# Patient Record
Sex: Female | Born: 1965 | Race: White | Hispanic: No | Marital: Married | Smoking: Never smoker
Health system: Southern US, Community
[De-identification: ages and names within clinical notes are randomized; demographics above are authoritative.]

## PROBLEM LIST (undated history)

## (undated) DIAGNOSIS — G8929 Other chronic pain: Secondary | ICD-10-CM

## (undated) DIAGNOSIS — M542 Cervicalgia: Secondary | ICD-10-CM

## (undated) DIAGNOSIS — D693 Immune thrombocytopenic purpura: Secondary | ICD-10-CM

## (undated) HISTORY — PX: HERNIA REPAIR: SHX51

## (undated) HISTORY — PX: BACK SURGERY: SHX140

## (undated) HISTORY — PX: CHOLECYSTECTOMY: SHX55

---

## 2002-01-10 ENCOUNTER — Ambulatory Visit (HOSPITAL_COMMUNITY): Admission: RE | Admit: 2002-01-10 | Discharge: 2002-01-10 | Payer: Self-pay | Admitting: Internal Medicine

## 2005-09-15 ENCOUNTER — Ambulatory Visit (HOSPITAL_COMMUNITY): Admission: RE | Admit: 2005-09-15 | Discharge: 2005-09-15 | Payer: Self-pay | Admitting: Neurological Surgery

## 2005-12-30 ENCOUNTER — Encounter (HOSPITAL_COMMUNITY): Admission: RE | Admit: 2005-12-30 | Discharge: 2006-01-29 | Payer: Self-pay | Admitting: Neurological Surgery

## 2006-01-30 ENCOUNTER — Encounter (HOSPITAL_COMMUNITY): Admission: RE | Admit: 2006-01-30 | Discharge: 2006-03-01 | Payer: Self-pay | Admitting: Neurological Surgery

## 2009-11-14 ENCOUNTER — Emergency Department (HOSPITAL_COMMUNITY): Admission: EM | Admit: 2009-11-14 | Discharge: 2009-11-15 | Payer: Self-pay | Admitting: Emergency Medicine

## 2010-12-31 NOTE — Op Note (Signed)
[name], [name] NO.:  [id number]   MEDICAL RECORD NO.:  [id number]          PATIENT TYPE:  AMB   LOCATION:  SDS                          FACILITY:  [facility]   PHYSICIAN:  Tia Alert, MD     DATE OF BIRTH:  Jan 11, 1966   DATE OF PROCEDURE:  09/15/2005  DATE OF DISCHARGE:                                 OPERATIVE REPORT   PREOPERATIVE DIAGNOSIS:  Lumbar disk herniation, L5-S1 on the left, with a  left S1 radiculopathy.   POSTOPERATIVE DIAGNOSIS:  Lumbar disk herniation, L5-S1 on the left, with a  left S1 radiculopathy.   PROCEDURE:  Lumbar hemilaminectomy, medial facetectomy and foraminotomy, L5-  S1 on the left, followed by microdiskectomy, L5-S1 on the left, utilizing  microscopic dissection.   SURGEON:  Tia Alert, M.D.   ASSISTANT:  Reinaldo Meeker, M.D.   ANESTHESIA:  General endotracheal.   COMPLICATIONS:  None apparent.   INDICATIONS FOR PROCEDURE:  Mrs. Vanessa [name] is a very pleasant 45 year old white  female, who was referred with left leg pain.  She had an MRI, which showed a  very large disk herniation at L5-S1 on the left with left S1 compression.  She had some weakness in her left calf and had lost her reflex and was  having fasciculations in the calf.  I recommended a microdiskectomy at L5-S1  on the left side.  She understood the risks and benefits and expected  outcome and wished to proceed.   DESCRIPTION OF PROCEDURE:  The patient was taken to the operating room and  after induction of adequate generalized endotracheal anesthesia, she was  rolled into the prone position on the Wilson frame and all pressure points  were padded and her lumbar region was prepped with DuraPrep and draped in  the usual sterile fashion.  Local anesthesia 10 mL was injected and a small  dorsal midline incision was made and carried down to the lumbosacral fascia.  The fascia was opened on the patient's left side and taken down in a  subperiosteal fashion to  expose L5-S1 on the left side.  Intraoperative x-  ray confirmed my level and then a hemilaminectomy, medial facetectomy and  foraminotomy was performed at L5-S1 on the left side.  The yellow ligament  was identified, opened and removed in a piecemeal fashion, exposing the  underlying dura and S1 nerve root.  The nerve root was thickened and red.  There was a free disk fragment extending out lateral to the nerve, which was  removed with a nerve hook and a pituitary rongeur.  I then were retracted  the nerve root medially and utilizing microscopic dissection incised the  disk space and performed a thorough intradiskal diskectomy.  I could palpate  the L5 as well as the S1 pedicles.  The midline was decompressed.  The nerve  roots were examined and found to be free of further compression.  A nerve  hook was passed easily into the foramina.  A coronary dilator was passed  easily into the midline and into the foramina once we were assured  we had a  nice diskectomy and decompression, the wound was copiously irrigated with  bacitracin-containing saline solution.  The nerve root was inspected once  again.  The dura was lined with Gelfoam and then the fascia was closed with  0 Vicryl.  The subcutaneous and subcuticular tissues were closed with 2-0  and 3-0 Vicryl and  the skin was closed with Dermabond.  The drapes were removed.  The patient  was awakened from general anesthesia and transferred to the recovery room in  stable condition.  At the end of procedure all sponge, needle and sponge  counts were correct.      Tia Alert, MD  Electronically Signed     DSJ/MEDQ  D:  09/15/2005  T:  09/15/2005  Job:  [id number]

## 2010-12-31 NOTE — Op Note (Signed)
[facility]  Patient:    Vanessa [name], Vanessa [name] Visit Number: [id number] MRN: [id number]          Service Type: END Location: DAY Attending Physician:  Malissa Hippo Dictated by:   Lionel December, M.D. Proc. Date: 01/10/02 Admit Date:  01/10/2002   CC:         Dr. Malvin Johns, Dyanne Iha   Operative Report  PROCEDURE:  Esophagogastroduodenoscopy followed by total colonoscopy.  INDICATIONS:  Vanessa [name] is a 45 year old Caucasian female who was evaluated in the office for GERD refractory therapy.  She was seen in the office on Dec 20, 2001, and she was scheduled for esophagogastroduodenoscopy.  She has been on Nexium 40 mg b.i.d. with minimal benefit.  A few days after her office visit, she called, stating that she had 1-1/2 days of rectal bleeding. Therefore, she requested that her colonoscopy also be performed at the time of EGD.  PROCEDURE:  Both of the procedures were reviewed with the patient, and an informed consent was obtained.  She has not had any more bleeding since that episode.  She did have some abdominal cramps, but these are resolved. She did not experience any fever or chills.  PREOPERATIVE MEDICATIONS:  Cetacaine spray for oropharyngeal topical anesthesia, Demerol 50 mg IV, Versed 9 mg IV in divided dose.  INSTRUMENT:  Olympus video system.  FINDINGS:  Both of the procedures were performed in the endoscopy suite.  The patients vital signs and O2 saturation were monitored during the procedure and remained stable.  PROCEDURE #1:  Esophagogastroduodenoscopy.  The patient was placed in the left lateral position, and the endoscope was passed via oropharynx without any difficulty into the esophagus.  Esophagus:  The mucosa of the esophagus was normal throughout.  No changes were noted to suggest Barretts esophagus.  There was a focal area of edema at the GE junction, but no ring or stricture was noted.  There was a small to moderate size  sliding hiatal hernia.  As she heaved during the procedure, there was a prolapse of gastric folds into the esophagus.  Stomach: It was empty and distended, very well insufflation.  Folds in the proximal stomach were normal.  Examination of the mucosa at the gastric body, antrum, and pyloric channel, as well as angularis and fundus was normal.  Duodenum: Examination of the bulb and second part of the duodenum was also normal.  The endoscope was withdrawn.  The patient was prepared for procedure #2.  Total colonoscopy:  Rectal examination performed.  No abnormality noted on external or digital exam.  The scope was placed in the rectum and advanced to the region of the sigmoid colon beyond.  Preparation was satisfactory.  The scope was passed in the cecum, which was identified by the ileocecal valve and appendiceal orifice. Pictures were taken for the record.  As the scope was withdrawn, the mucosa was carefully examined.  There was a tiny polyp at hepatic flexure, which was ablated by cold biopsy.  The mucosa and rest of the colon was normal. The rectal mucosa similarly was normal.  She had a single small hemorrhoid below the dentate line. The endoscope was straightened and withdrawn.  The patient tolerated the procedure well.  FINAL DIAGNOSES: 1. Mild changes of reflux esophagitis limited to gastroesophageal junction.    Small to moderate size sliding hiatal hernia. 2. Normal examination of the stomach, first and second part of duodenum. 3. Single small external hemorrhoid and tiny polyp at hepatic flexure, which  is ablated by cold biopsy.  The rest of the exam was normal. 4. As far as the rectal bleeding is concerned, it is possible that she had    self-limiting bout of colitis.  No further workup necessary unless rectal    bleeding was to relapse.  RECOMMENDATIONS: 1. Antireflux measures reinforced. 2. For now, leave her on Nexium 40 mg b.i.d. and will start her on Reglan    10  mg before each meal and at bedtime.  She was informed of possible    side effects.  If she has any problems, she can simply stop the medication. 3. We will plan to see her back in the office in one month from now.  If she    fails this approach, antireflux surgery would be an option, but I would    like to see that she has a lost a few pounds and is also able to quit    cigarette smoking. Dictated by:   Lionel December, M.D. Attending Physician:  Malissa Hippo DD:  01/10/02 TD:  01/11/02 Job: [id number]

## 2014-07-30 ENCOUNTER — Emergency Department (HOSPITAL_COMMUNITY)
Admission: EM | Admit: 2014-07-30 | Discharge: 2014-07-30 | Disposition: A | Discharge status: Home / Self Care | Payer: BC Managed Care – PPO | Attending: Emergency Medicine | Admitting: Emergency Medicine

## 2014-07-30 ENCOUNTER — Encounter (HOSPITAL_COMMUNITY): Payer: Self-pay | Admitting: *Deleted

## 2014-07-30 ENCOUNTER — Emergency Department (HOSPITAL_COMMUNITY): Payer: BC Managed Care – PPO

## 2014-07-30 DIAGNOSIS — Z862 Personal history of diseases of the blood and blood-forming organs and certain disorders involving the immune mechanism: Secondary | ICD-10-CM | POA: Insufficient documentation

## 2014-07-30 DIAGNOSIS — M542 Cervicalgia: Secondary | ICD-10-CM | POA: Diagnosis present

## 2014-07-30 DIAGNOSIS — Z7952 Long term (current) use of systemic steroids: Secondary | ICD-10-CM | POA: Diagnosis not present

## 2014-07-30 DIAGNOSIS — G8929 Other chronic pain: Secondary | ICD-10-CM | POA: Diagnosis not present

## 2014-07-30 DIAGNOSIS — Z79899 Other long term (current) drug therapy: Secondary | ICD-10-CM | POA: Insufficient documentation

## 2014-07-30 DIAGNOSIS — M545 Low back pain: Secondary | ICD-10-CM | POA: Diagnosis not present

## 2014-07-30 HISTORY — DX: Immune thrombocytopenic purpura: D69.3

## 2014-07-30 HISTORY — DX: Cervicalgia: M54.2

## 2014-07-30 HISTORY — DX: Other chronic pain: G89.29

## 2014-07-30 MED ORDER — CYCLOBENZAPRINE HCL 10 MG PO TABS
10.0000 mg | ORAL_TABLET | Freq: Once | ORAL | Status: AC
  Administered 2014-07-30: 10 mg via ORAL
  Filled 2014-07-30: qty 1

## 2014-07-30 MED ORDER — CYCLOBENZAPRINE HCL 10 MG PO TABS: 10.0000 mg | ORAL_TABLET | Freq: Three times a day (TID) | ORAL | Status: DC | PRN

## 2014-07-30 MED ORDER — PREDNISONE 10 MG PO TABS: ORAL_TABLET | ORAL | Status: DC

## 2014-07-30 MED ORDER — OXYCODONE-ACETAMINOPHEN 5-325 MG PO TABS
1.0000 | ORAL_TABLET | Freq: Once | ORAL | Status: AC
  Administered 2014-07-30: 1 via ORAL
  Filled 2014-07-30: qty 1

## 2014-07-30 MED ORDER — OXYCODONE-ACETAMINOPHEN 5-325 MG PO TABS: 1.0000 | ORAL_TABLET | ORAL | Status: DC | PRN

## 2014-07-30 NOTE — ED Notes (Signed)
Pt states she has always had neck problems but they have become worse over past few weeks.

## 2014-08-01 NOTE — ED Provider Notes (Signed)
CSN: [id number]     Arrival date & time 07/30/14  1817 History   First MD Initiated Contact with Patient 07/30/14 1832     Chief Complaint  Patient presents with  . Neck Pain     (Consider location/radiation/quality/duration/timing/severity/associated sxs/prior Treatment) HPI  Vanessa [name] is a 48 y.o. female with hx of chronic neck pain presents to the Emergency Department complaining of recurrent , worsening neck pain for several weeks.  She describes a localized sharp, numb, tingling sensation to the base of her neck with pain to her left neck and across the top of her shoulder.  She states the pain is worse with movement.  She reports having similar pain in her lower back in the past.  She states that sometimes, she will have pains "shoot" into her left arm, but not continuously.  She has tried OTC pain relievers without significant improvement.  She denies known injury, headaches, chest pain, shortness of breath, dizziness, numbness or weakness of the upper extremities.  She denies previous imaging of her neck.     Past Medical History  Diagnosis Date  . Neck pain, chronic   . ITP (idiopathic thrombocytopenic purpura)     at age 48 but no problems since then.   Past Surgical History  Procedure Laterality Date  . Cholecystectomy    . Hernia repair    . Back surgery     No family history on file. History  Substance Use Topics  . Smoking status: Never Smoker   . Smokeless tobacco: Not on file  . Alcohol Use: No   OB History    No data available     Review of Systems  Constitutional: Negative for fever.  Respiratory: Negative for shortness of breath.   Cardiovascular: Negative for chest pain.  Gastrointestinal: Negative for vomiting, abdominal pain and constipation.  Genitourinary: Negative for dysuria, hematuria, flank pain, decreased urine volume and difficulty urinating.       Low back pain  Musculoskeletal: Positive for neck pain. Negative for back pain and joint  swelling.  Skin: Negative for rash.  Neurological: Negative for dizziness, syncope, facial asymmetry, speech difficulty, weakness, numbness and headaches.  All other systems reviewed and are negative.     Allergies  Septra and Wellbutrin  Home Medications   Prior to Admission medications   Medication Sig Start Date End Date Taking? Authorizing Provider  ibuprofen (ADVIL,MOTRIN) 200 MG tablet Take 800 mg by mouth every 8 (eight) hours as needed (pain).   Yes Historical Provider, MD  cyclobenzaprine (FLEXERIL) 10 MG tablet Take 1 tablet (10 mg total) by mouth 3 (three) times daily as needed. 07/30/14   Alamin Mccuiston L. Gabreille Dardis, PA-C  oxyCODONE-acetaminophen (PERCOCET/ROXICET) 5-325 MG per tablet Take 1 tablet by mouth every 4 (four) hours as needed. 07/30/14   Lio Wehrly L. Steffan Caniglia, PA-C  predniSONE (DELTASONE) 10 MG tablet Take 6 tablets day one, 5 tablets day two, 4 tablets day three, 3 tablets day four, 2 tablets day five, then 1 tablet day six 07/30/14   Dorotha Hirschi L. Zvi Duplantis, PA-C   BP 142/82 mmHg  Pulse 85  Temp(Src) 98.2 F (36.8 C) (Oral)  Resp 20  Ht 5\' 6"  (1.676 m)  Wt 169 lb (76.658 kg)  BMI 27.29 kg/m2  SpO2 100%  LMP 07/30/2014 (Exact Date) Physical Exam  Constitutional: She is oriented to person, place, and time. She appears well-developed and well-nourished. No distress.  HENT:  Head: Normocephalic and atraumatic.  Mouth/Throat: Oropharynx is clear and moist.  Eyes: EOM are normal. Pupils are equal, round, and reactive to light.  Neck: Phonation normal. Neck supple. Spinous process tenderness and muscular tenderness present. No rigidity. Decreased range of motion present. No erythema present. No thyromegaly present.    ttp of the mid to lower cervical spine and left paraspinal muscles and along the left trapezius muscle.  Grip strength is strong and equal bilaterally.  Distal sensation and bilateral radial pulses intact,    5/5 strength against resistance of the bilateral UE's    Cardiovascular: Normal rate, regular rhythm, normal heart sounds and intact distal pulses.   No murmur heard. Pulmonary/Chest: Effort normal and breath sounds normal. No respiratory distress. She exhibits no tenderness.  Musculoskeletal: She exhibits tenderness. She exhibits no edema.       Cervical back: She exhibits tenderness. She exhibits normal range of motion, no bony tenderness, no swelling, no deformity, no spasm and normal pulse.  See neck exam  Lymphadenopathy:    She has no cervical adenopathy.  Neurological: She is alert and oriented to person, place, and time. She has normal strength. No sensory deficit. She exhibits normal muscle tone. Coordination normal.  Reflex Scores:      Tricep reflexes are 2+ on the right side and 2+ on the left side.      Bicep reflexes are 2+ on the right side and 2+ on the left side. Skin: Skin is warm and dry.  Nursing note and vitals reviewed.   ED Course  Procedures (including critical care time) Labs Review Labs Reviewed - No data to display  Imaging Review Dg Cervical Spine Complete  07/30/2014   CLINICAL DATA:  Worsening neck pain over the last 2 weeks. No recent injury.  EXAM: CERVICAL SPINE  4+ VIEWS  COMPARISON:  None.  FINDINGS: The lateral view images through the bottom of C7. Prevertebral soft tissues are within normal limits. Reversal of expected cervical lordosis, centered about the C6 level. Maintenance of vertebral body height. Evaluation of the left neural foramina is limited secondary to positioning on the oblique image. There is likely neural foraminal narrowing at C6-7. On the right, there is multilevel neural foraminal narrowing, including at C 5-6 and C6-7. Lateral masses and odontoid process are within normal limits.  Cervical thoracic junction not well evaluated on swimmer's view. Markedly age advanced cervical spondylosis. Loss of intervertebral disc height and endplate osteophyte formation which is most advanced at C5-7.  Extensive facet arthropathy at multiple levels, including C2-3 and C3-4.  IMPRESSION: Markedly age advanced spondylosis, most advanced at C5-7. Straightening and mild reversal of expected lordosis centered about C6 which is likely secondary.  Suboptimal evaluation of the cervical thoracic junction.   Electronically Signed   By: Jeronimo Greaves M.D.   On: 07/30/2014 19:33     EKG Interpretation None      MDM   Final diagnoses:  Neck pain    Patient here for recurrent, chronic neck pain.  She is requesting MRI.  She is well appearing without focal neurological deficits.  I have explained to her that emergent MRI is not warranted at this time but may be needed for definitive diagnosis.  She agrees to plain film imaging tonight and she has appt with her PMD on Tuesday.    Patient is feeling better after medication, ready for d/c.  I have reviewed the imaging results with her and given her copies to take with her to her PMD in [place].  She agrees to prednisone taper, flexeril and  percocet.  She appears stable for d/c    Sahan Pen L. Trisha Mangleriplett, PA-C 08/01/14 1334  Flint MelterElliott L Wentz, MD 08/02/14 [phone]

## 2014-12-16 ENCOUNTER — Emergency Department (HOSPITAL_COMMUNITY)
Admission: EM | Admit: 2014-12-16 | Discharge: 2014-12-16 | Disposition: A | Discharge status: Home / Self Care | Payer: BC Managed Care – PPO | Attending: Emergency Medicine | Admitting: Emergency Medicine

## 2014-12-16 ENCOUNTER — Encounter (HOSPITAL_COMMUNITY): Payer: Self-pay | Admitting: Emergency Medicine

## 2014-12-16 DIAGNOSIS — Y92410 Unspecified street and highway as the place of occurrence of the external cause: Secondary | ICD-10-CM | POA: Diagnosis not present

## 2014-12-16 DIAGNOSIS — Y998 Other external cause status: Secondary | ICD-10-CM | POA: Diagnosis not present

## 2014-12-16 DIAGNOSIS — Y9389 Activity, other specified: Secondary | ICD-10-CM | POA: Diagnosis not present

## 2014-12-16 DIAGNOSIS — S199XXA Unspecified injury of neck, initial encounter: Secondary | ICD-10-CM | POA: Diagnosis present

## 2014-12-16 DIAGNOSIS — G8929 Other chronic pain: Secondary | ICD-10-CM | POA: Insufficient documentation

## 2014-12-16 DIAGNOSIS — S161XXA Strain of muscle, fascia and tendon at neck level, initial encounter: Secondary | ICD-10-CM | POA: Insufficient documentation

## 2014-12-16 DIAGNOSIS — Z862 Personal history of diseases of the blood and blood-forming organs and certain disorders involving the immune mechanism: Secondary | ICD-10-CM | POA: Insufficient documentation

## 2014-12-16 MED ORDER — DIAZEPAM 5 MG PO TABS: 5.0000 mg | ORAL_TABLET | Freq: Four times a day (QID) | ORAL | Status: DC | PRN

## 2014-12-16 MED ORDER — DIAZEPAM 5 MG PO TABS
5.0000 mg | ORAL_TABLET | Freq: Once | ORAL | Status: AC
  Administered 2014-12-16: 5 mg via ORAL
  Filled 2014-12-16: qty 1

## 2014-12-16 MED ORDER — IBUPROFEN 400 MG PO TABS
600.0000 mg | ORAL_TABLET | Freq: Once | ORAL | Status: AC
  Administered 2014-12-16: 600 mg via ORAL
  Filled 2014-12-16: qty 2

## 2014-12-16 MED ORDER — ACETAMINOPHEN 500 MG PO TABS
1000.0000 mg | ORAL_TABLET | Freq: Once | ORAL | Status: AC
  Administered 2014-12-16: 1000 mg via ORAL
  Filled 2014-12-16: qty 2

## 2014-12-16 NOTE — ED Notes (Signed)
Pt alert & oriented x4, stable gait. Patient given discharge instructions, paperwork & prescription(s). Patient informed not to drive, operate any equipment & handel any important documents 4 hours after taking pain medication. Patient  instructed to stop at the registration desk to finish any additional paperwork. Patient  verbalized understanding. Pt left department w/ no further questions. 
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
## 2014-12-16 NOTE — Discharge Instructions (Signed)
If you were given medicines take as directed.  If you are on coumadin or contraceptives realize their levels and effectiveness is altered by many different medicines.  If you have any reaction (rash, tongues swelling, other) to the medicines stop taking and see a physician.   Please follow up as directed and return to the ER or see a physician for new or worsening symptoms.  Thank you. Filed Vitals:   12/16/14 1756 12/16/14 1929  BP: 132/93 140/93  Pulse: 70 77  Temp: 97.7 F (36.5 C) 97.6 F (36.4 C)  TempSrc: Oral Oral  Resp: 18 18  Height:  (1.651 m)   Weight: 179 lb (81.194 kg)   SpO2: 100% 100%    Cervical Sprain A cervical sprain is an injury in the neck in which the strong, fibrous tissues (ligaments) that connect your neck bones stretch or tear. Cervical sprains can range from mild to severe. Severe cervical sprains can cause the neck vertebrae to be unstable. This can lead to damage of the spinal cord and can result in serious nervous system problems. The amount of time it takes for a cervical sprain to get better depends on the cause and extent of the injury. Most cervical sprains heal in 1 to 3 weeks. CAUSES  Severe cervical sprains may be caused by:   Contact sport injuries (such as from football, rugby, wrestling, hockey, auto racing, gymnastics, diving, martial arts, or boxing).   Motor vehicle collisions.   Whiplash injuries. This is an injury from a sudden forward and backward whipping movement of the head and neck.  Falls.  Mild cervical sprains may be caused by:   Being in an awkward position, such as while cradling a telephone between your ear and shoulder.   Sitting in a chair that does not offer proper support.   Working at a poorly Marketing executive station.   Looking up or down for long periods of time.  SYMPTOMS   Pain, soreness, stiffness, or a burning sensation in the front, back, or sides of the neck. This discomfort may develop  immediately after the injury or slowly, 24 hours or more after the injury.   Pain or tenderness directly in the middle of the back of the neck.   Shoulder or upper back pain.   Limited ability to move the neck.   Headache.   Dizziness.   Weakness, numbness, or tingling in the hands or arms.   Muscle spasms.   Difficulty swallowing or chewing.   Tenderness and swelling of the neck.  DIAGNOSIS  Most of the time your health care provider can diagnose a cervical sprain by taking your history and doing a physical exam. Your health care provider will ask about previous neck injuries and any known neck problems, such as arthritis in the neck. X-rays may be taken to find out if there are any other problems, such as with the bones of the neck. Other tests, such as a CT scan or MRI, may also be needed.  TREATMENT  Treatment depends on the severity of the cervical sprain. Mild sprains can be treated with rest, keeping the neck in place (immobilization), and pain medicines. Severe cervical sprains are immediately immobilized. Further treatment is done to help with pain, muscle spasms, and other symptoms and may include:  Medicines, such as pain relievers, numbing medicines, or muscle relaxants.   Physical therapy. This may involve stretching exercises, strengthening exercises, and posture training. Exercises and improved posture can help stabilize the neck,  strengthen muscles, and help stop symptoms from returning.  HOME CARE INSTRUCTIONS   Put ice on the injured area.   Put ice in a plastic bag.   Place a towel between your skin and the bag.   Leave the ice on for 15-20 minutes, 3-4 times a day.   If your injury was severe, you may have been given a cervical collar to wear. A cervical collar is a two-piece collar designed to keep your neck from moving while it heals.  Do not remove the collar unless instructed by your health care provider.  If you have long hair, keep it  outside of the collar.  Ask your health care provider before making any adjustments to your collar. Minor adjustments may be required over time to improve comfort and reduce pressure on your chin or on the back of your head.  Ifyou are allowed to remove the collar for cleaning or bathing, follow your health care provider's instructions on how to do so safely.  Keep your collar clean by wiping it with mild soap and water and drying it completely. If the collar you have been given includes removable pads, remove them every 1-2 days and hand wash them with soap and water. Allow them to air dry. They should be completely dry before you wear them in the collar.  If you are allowed to remove the collar for cleaning and bathing, wash and dry the skin of your neck. Check your skin for irritation or sores. If you see any, tell your health care provider.  Do not drive while wearing the collar.   Only take over-the-counter or prescription medicines for pain, discomfort, or fever as directed by your health care provider.   Keep all follow-up appointments as directed by your health care provider.   Keep all physical therapy appointments as directed by your health care provider.   Make any needed adjustments to your workstation to promote good posture.   Avoid positions and activities that make your symptoms worse.   Warm up and stretch before being active to help prevent problems.  SEEK MEDICAL CARE IF:   Your pain is not controlled with medicine.   You are unable to decrease your pain medicine over time as planned.   Your activity level is not improving as expected.  SEEK IMMEDIATE MEDICAL CARE IF:   You develop any bleeding.  You develop stomach upset.  You have signs of an allergic reaction to your medicine.   Your symptoms get worse.   You develop new, unexplained symptoms.   You have numbness, tingling, weakness, or paralysis in any part of your body.  MAKE SURE YOU:    Understand these instructions.  Will watch your condition.  Will get help right away if you are not doing well or get worse. Document Released: 05/29/2007 Document Revised: 08/06/2013 Document Reviewed: 02/06/2013 [facility] Patient Information 2015 [place], [place]. This information is not intended to replace advice given to you by your health care provider. Make sure you discuss any questions you have with your health care provider.
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
## 2016-01-22 ENCOUNTER — Emergency Department (HOSPITAL_COMMUNITY): Payer: BC Managed Care – PPO

## 2016-01-22 ENCOUNTER — Encounter (HOSPITAL_COMMUNITY): Payer: Self-pay

## 2016-01-22 ENCOUNTER — Emergency Department (HOSPITAL_COMMUNITY)
Admission: EM | Admit: 2016-01-22 | Discharge: 2016-01-22 | Disposition: A | Discharge status: Home / Self Care | Payer: BC Managed Care – PPO | Attending: Emergency Medicine | Admitting: Emergency Medicine

## 2016-01-22 DIAGNOSIS — R079 Chest pain, unspecified: Secondary | ICD-10-CM | POA: Diagnosis not present

## 2016-01-22 DIAGNOSIS — R1031 Right lower quadrant pain: Secondary | ICD-10-CM | POA: Diagnosis present

## 2016-01-22 DIAGNOSIS — K219 Gastro-esophageal reflux disease without esophagitis: Secondary | ICD-10-CM | POA: Diagnosis not present

## 2016-01-22 LAB — CBC WITH DIFFERENTIAL/PLATELET
Basophils Absolute: 0 10*3/uL (ref 0.0–0.1)
Basophils Relative: 0 %
EOS ABS: 0.2 10*3/uL (ref 0.0–0.7)
EOS PCT: 2 %
HCT: 42.5 % (ref 36.0–46.0)
HEMOGLOBIN: 14.2 g/dL (ref 12.0–15.0)
LYMPHS ABS: 2.8 10*3/uL (ref 0.7–4.0)
LYMPHS PCT: 28 %
MCH: 33.1 pg (ref 26.0–34.0)
MCHC: 33.4 g/dL (ref 30.0–36.0)
MCV: 99.1 fL (ref 78.0–100.0)
MONO ABS: 0.9 10*3/uL (ref 0.1–1.0)
Monocytes Relative: 9 %
Neutro Abs: 6 10*3/uL (ref 1.7–7.7)
Neutrophils Relative %: 61 %
PLATELETS: 307 10*3/uL (ref 150–400)
RBC: 4.29 MIL/uL (ref 3.87–5.11)
RDW: 12.8 % (ref 11.5–15.5)
WBC: 9.9 10*3/uL (ref 4.0–10.5)

## 2016-01-22 LAB — URINALYSIS, ROUTINE W REFLEX MICROSCOPIC
Bilirubin Urine: NEGATIVE
GLUCOSE, UA: NEGATIVE mg/dL
HGB URINE DIPSTICK: NEGATIVE
Ketones, ur: NEGATIVE mg/dL
Leukocytes, UA: NEGATIVE
Nitrite: NEGATIVE
PH: 5.5 (ref 5.0–8.0)
Protein, ur: NEGATIVE mg/dL
SPECIFIC GRAVITY, URINE: 1.025 (ref 1.005–1.030)

## 2016-01-22 LAB — COMPREHENSIVE METABOLIC PANEL
ALBUMIN: 4.2 g/dL (ref 3.5–5.0)
ALT: 12 U/L — AB (ref 14–54)
AST: 14 U/L — AB (ref 15–41)
Alkaline Phosphatase: 70 U/L (ref 38–126)
Anion gap: 7 (ref 5–15)
BUN: 7 mg/dL (ref 6–20)
CHLORIDE: 102 mmol/L (ref 101–111)
CO2: 28 mmol/L (ref 22–32)
CREATININE: 0.79 mg/dL (ref 0.44–1.00)
Calcium: 8.9 mg/dL (ref 8.9–10.3)
GFR calc Af Amer: >60 mL/min (ref >60)
GLUCOSE: 105 mg/dL — AB (ref 65–99)
Potassium: 3.1 mmol/L — ABNORMAL LOW (ref 3.5–5.1)
Sodium: 137 mmol/L (ref 135–145)
Total Bilirubin: 0.3 mg/dL (ref 0.3–1.2)
Total Protein: 7.4 g/dL (ref 6.5–8.1)

## 2016-01-22 LAB — TROPONIN I: Troponin I: <0.03 ng/mL (ref <0.031)

## 2016-01-22 LAB — LIPASE, BLOOD: LIPASE: 36 U/L (ref 11–51)

## 2016-01-22 MED ORDER — KETOROLAC TROMETHAMINE 30 MG/ML IJ SOLN
30.0000 mg | Freq: Once | INTRAMUSCULAR | Status: AC
  Administered 2016-01-22: 30 mg via INTRAVENOUS
  Filled 2016-01-22: qty 1

## 2016-01-22 MED ORDER — IOPAMIDOL (ISOVUE-300) INJECTION 61%
100.0000 mL | Freq: Once | INTRAVENOUS | Status: AC | PRN
  Administered 2016-01-22: 100 mL via INTRAVENOUS

## 2016-01-22 MED ORDER — SODIUM CHLORIDE 0.9 % IV BOLUS (SEPSIS)
1000.0000 mL | Freq: Once | INTRAVENOUS | Status: AC
  Administered 2016-01-22: 1000 mL via INTRAVENOUS

## 2016-01-22 MED ORDER — DIATRIZOATE MEGLUMINE & SODIUM 66-10 % PO SOLN
ORAL | Status: AC
  Filled 2016-01-22: qty 30

## 2016-01-22 MED ORDER — PANTOPRAZOLE SODIUM 40 MG PO TBEC
40.0000 mg | DELAYED_RELEASE_TABLET | Freq: Once | ORAL | Status: AC
  Administered 2016-01-22: 40 mg via ORAL
  Filled 2016-01-22: qty 1

## 2016-01-22 MED ORDER — ONDANSETRON 4 MG PO TBDP: 4.0000 mg | ORAL_TABLET | Freq: Three times a day (TID) | ORAL | Status: AC | PRN

## 2016-01-22 MED ORDER — GI COCKTAIL ~~LOC~~
30.0000 mL | Freq: Once | ORAL | Status: AC
  Administered 2016-01-22: 30 mL via ORAL
  Filled 2016-01-22: qty 30

## 2016-01-22 MED ORDER — SUCRALFATE 1 GM/10ML PO SUSP: 1.0000 g | Freq: Three times a day (TID) | ORAL | Status: AC

## 2016-01-22 MED ORDER — POTASSIUM CHLORIDE CRYS ER 20 MEQ PO TBCR
40.0000 meq | EXTENDED_RELEASE_TABLET | Freq: Once | ORAL | Status: AC
  Administered 2016-01-22: 40 meq via ORAL
  Filled 2016-01-22: qty 2

## 2016-01-22 MED ORDER — PANTOPRAZOLE SODIUM 40 MG PO TBEC: 40.0000 mg | DELAYED_RELEASE_TABLET | Freq: Every day | ORAL | Status: AC

## 2016-01-22 MED ORDER — ONDANSETRON HCL 4 MG/2ML IJ SOLN
4.0000 mg | Freq: Once | INTRAMUSCULAR | Status: AC
  Administered 2016-01-22: 4 mg via INTRAVENOUS
  Filled 2016-01-22: qty 2

## 2016-01-22 NOTE — ED Provider Notes (Signed)
TIME SEEN: 4:50 AM  CHIEF COMPLAINT: Multiple complaints  HPI: Pt is a 50 y.o. female with history of chronic neck pain, ITP when she was 50 years old who presents to the emergency department with multiple different complaints. She states her major reason for coming to the emergency department his right-sided sharp stabbing flank pain that has now migrated to the right lower abdomen. Has had nausea but no vomiting. Has had loose stools for the past 3 days after eating. Denies history of kidney stone. Status post cholecystectomy. No aggravating or relieving factors. Has never had similar symptoms. Denies dysuria, hematuria, vaginal bleeding or discharge, bloody stools or melena. She is postmenopausal.   Patient also complains of feeling bloated for the past year. States she has seen her PCP Dr. Clelia Croft in [place] for this. Does not have a gastroenterology was. Reports she had an endoscopy and colonoscopy that she reports was normal over 15 years ago.   Also reports that she has noticed intermittent swelling in her hands and feet for the past several months. None currently.   Also complaining of her chronic neck pain is worse with movement of her neck and sitting upright in the bed. No new injury. Does report that she will have intermittent numbness in her arms and legs. Mostly now on her left arm but she is not having any currently. No focal weakness. No bowel or bladder incontinence.    Also states that she has been losing her hair for the past several months. Feels that she is losing more and she should be.   Also complaining of burning central chest pain that radiates into her throat. States that this feels like heartburn. Does have some intermittent shortness of breath. None currently. No history of PE or DVT. No history of CAD. Pain is not exertional or pleuritic.  ROS: See HPI Constitutional: no fever  Eyes: no drainage  ENT: no runny nose   Cardiovascular:   chest pain  Resp:  SOB  GI: no  vomiting GU: no dysuria Integumentary: no rash  Allergy: no hives  Musculoskeletal: no leg swelling  Neurological: no slurred speech ROS otherwise negative  PAST MEDICAL HISTORY/PAST SURGICAL HISTORY:  Past Medical History  Diagnosis Date  . Neck pain, chronic   . ITP (idiopathic thrombocytopenic purpura)     at age 71 but no problems since then.    MEDICATIONS:  Prior to Admission medications   Medication Sig Start Date End Date Taking? Authorizing Provider  cyclobenzaprine (FLEXERIL) 10 MG tablet Take 1 tablet (10 mg total) by mouth 3 (three) times daily as needed. Patient not taking: Reported on 12/16/2014 07/30/14   Tammy Triplett, PA-C  diazepam (VALIUM) 5 MG tablet Take 1 tablet (5 mg total) by mouth every 6 (six) hours as needed (spasms). 12/16/14   Blane Ohara, MD  ibuprofen (ADVIL,MOTRIN) 200 MG tablet Take 800 mg by mouth every 8 (eight) hours as needed (pain).    Historical Provider, MD  oxyCODONE-acetaminophen (PERCOCET/ROXICET) 5-325 MG per tablet Take 1 tablet by mouth every 4 (four) hours as needed. Patient not taking: Reported on 12/16/2014 07/30/14   Tammy Triplett, PA-C  predniSONE (DELTASONE) 10 MG tablet Take 6 tablets day one, 5 tablets day two, 4 tablets day three, 3 tablets day four, 2 tablets day five, then 1 tablet day six Patient not taking: Reported on 12/16/2014 07/30/14   Tammy Triplett, PA-C    ALLERGIES:  Allergies  Allergen Reactions  . Septra [Sulfamethoxazole-Trimethoprim]   . Wellbutrin [  Bupropion]     SOCIAL HISTORY:  Social History  Substance Use Topics  . Smoking status: Never Smoker   . Smokeless tobacco: Not on file  . Alcohol Use: No    FAMILY HISTORY: No family history on file.  EXAM: BP 140/99 mmHg  Pulse 89  Temp(Src) 97.6 F (36.4 C) (Oral)  Resp 18  Ht 5\' 6"  (1.676 m)  Wt 175 lb (79.379 kg)  BMI 28.26 kg/m2  SpO2 100%  LMP 11/12/2014 CONSTITUTIONAL: Alert and oriented and responds appropriately to questions.  Well-appearing; well-nourished HEAD: Normocephalic EYES: Conjunctivae clear, PERRL ENT: normal nose; no rhinorrhea; moist mucous membranes NECK: Supple, no meningismus, no LAD, no midline spinal tenderness or step-off or deformity  CARD: RRR; S1 and S2 appreciated; no murmurs, no clicks, no rubs, no gallops RESP: Normal chest excursion without splinting or tachypnea; breath sounds clear and equal bilaterally; no wheezes, no rhonchi, no rales, no hypoxia or respiratory distress, speaking full sentences ABD/GI: Normal bowel sounds; non-distended; soft, tender to palpation in the right lower abdomen, no rebound, no guarding, no peritoneal signs BACK:  The back appears normal and is non-tender to palpation, there is no CVA tenderness, no midline spinal tenderness or step-off or deformity EXT: Normal ROM in all joints; non-tender to palpation; no edema; normal capillary refill; no cyanosis, no calf tenderness or swelling    SKIN: Normal color for age and race; warm; no rash NEURO: Moves all extremities equally, sensation to light touch intact diffusely, cranial nerves II through XII intact, normal gait PSYCH: The patient's mood and manner are appropriate. Grooming and personal hygiene are appropriate.  MEDICAL DECISION MAKING: Patient here with multiple different complaints. Most of these complaints seem nonemergent. She is tender in the right lower abdomen. Differential includes kidney stone, pyelonephritis, appendicitis, colitis. We'll obtain labs, urine and CT of her abdomen and pelvis. As for her chest pain, I think this is GERD. Will treat with GI cocktail, Protonix but will obtain troponin and chest x-ray given her age. EKG shows no ischemic abnormality. She does not appear volume overload on exam. She is neurologically intact currently. Her neck pain is chronic with no new injury. There is no midline spinal tenderness on exam. She does not appear malnourished, dehydrated. Discussed with patient at  length that many of these issues seen chronic in nature and I recommend close follow-up with her primary care.  ED PROGRESS: Patient's labs are unremarkable other than mildly low potassium at 3.1 which has been replaced. No leukocytosis. No anemia. LFTs, lipase and creatinine normal. Troponin negative. Urine shows no sign of infection or dehydration. Chest x-ray clear and CT of her abdomen and pelvis is normal. Discussed with patient that given all of her multiple symptoms I recommend close follow-up with her PCP. Have also recommended follow-up with a gastroenterologist and she may need another endoscopy, colonoscopy. We'll discharge home with prescriptions for Protonix, Zofran. Have recommended eating a bland diet, drinking increased amounts of water. Discussed return precautions. Patient verbalizes understanding and is comfortable with this plan.    At this time, I do not feel there is any life-threatening condition present. I have reviewed and discussed all results (EKG, imaging, lab, urine as appropriate), exam findings with patient. I have reviewed nursing notes and appropriate previous records.  I feel the patient is safe to be discharged home without further emergent workup. Discussed usual and customary return precautions. Patient and family (if present) verbalize understanding and are comfortable with this plan.  Patient  will follow-up with their primary care provider. If they do not have a primary care provider, information for follow-up has been provided to them. All questions have been answered.     EKG Interpretation  Date/Time:  Friday January 22 2016 04:54:28 EDT Ventricular Rate:  80 PR Interval:  136 QRS Duration: 91 QT Interval:  378 QTC Calculation: 436 R Axis:   60 Text Interpretation:  Sinus rhythm Low voltage, precordial leads No significant change since last tracing Confirmed by WARD,  DO, KRISTEN [phone]) on 01/22/2016 5:03:36 AM        Layla Maw Ward, DO 01/22/16 [id number]

## 2016-01-22 NOTE — ED Notes (Signed)
Pt reports sharp stabbing pain to her right flank area x 1 day with nausea, no vomiting, states has had some loose stools recently as well.

## 2016-01-22 NOTE — ED Notes (Signed)
Pt also reports intermittent burning type of pain in her chest/epigastric area that goes up into her neck.

## 2016-01-22 NOTE — Discharge Instructions (Signed)
Abdominal Pain, Adult Many things can cause abdominal pain. Usually, abdominal pain is not caused by a disease and will improve without treatment. It can often be observed and treated at home. Your health care provider will do a physical exam and possibly order blood tests and X-rays to help determine the seriousness of your pain. However, in many cases, more time must pass before a clear cause of the pain can be found. Before that point, your health care provider may not know if you need more testing or further treatment. HOME CARE INSTRUCTIONS Monitor your abdominal pain for any changes. The following actions may help to alleviate any discomfort you are experiencing:  Only take over-the-counter or prescription medicines as directed by your health care provider.  Do not take laxatives unless directed to do so by your health care provider.  Try a clear liquid diet (broth, tea, or water) as directed by your health care provider. Slowly move to a bland diet as tolerated. SEEK MEDICAL CARE IF:  You have unexplained abdominal pain.  You have abdominal pain associated with nausea or diarrhea.  You have pain when you urinate or have a bowel movement.  You experience abdominal pain that wakes you in the night.  You have abdominal pain that is worsened or improved by eating food.  You have abdominal pain that is worsened with eating fatty foods.  You have a fever. SEEK IMMEDIATE MEDICAL CARE IF:  Your pain does not go away within 2 hours.  You keep throwing up (vomiting).  Your pain is felt only in portions of the abdomen, such as the right side or the left lower portion of the abdomen.  You pass bloody or black tarry stools. MAKE SURE YOU:  Understand these instructions.  Will watch your condition.  Will get help right away if you are not doing well or get worse.   This information is not intended to replace advice given to you by your health care provider. Make sure you discuss  any questions you have with your health care provider.   Document Released: 05/11/2005 Document Revised: 04/22/2015 Document Reviewed: 04/10/2013 Elsevier Interactive Patient Education 2016 [employer]  Nonspecific Chest Pain  Chest pain can be caused by many different conditions. There is always a chance that your pain could be related to something serious, such as a heart attack or a blood clot in your lungs. Chest pain can also be caused by conditions that are not life-threatening. If you have chest pain, it is very important to follow up with your health care provider. CAUSES  Chest pain can be caused by:  Heartburn.  Pneumonia or bronchitis.  Anxiety or stress.  Inflammation around your heart (pericarditis) or lung (pleuritis or pleurisy).  A blood clot in your lung.  A collapsed lung (pneumothorax). It can develop suddenly on its own (spontaneous pneumothorax) or from trauma to the chest.  Shingles infection (varicella-zoster virus).  Heart attack.  Damage to the bones, muscles, and cartilage that make up your chest wall. This can include:  Bruised bones due to injury.  Strained muscles or cartilage due to frequent or repeated coughing or overwork.  Fracture to one or more ribs.  Sore cartilage due to inflammation (costochondritis). RISK FACTORS  Risk factors for chest pain may include:  Activities that increase your risk for trauma or injury to your chest.  Respiratory infections or conditions that cause frequent coughing.  Medical conditions or overeating that can cause heartburn.  Heart disease or family  history of heart disease.  Conditions or health behaviors that increase your risk of developing a blood clot.  Having had chicken pox (varicella zoster). SIGNS AND SYMPTOMS Chest pain can feel like:  Burning or tingling on the surface of your chest or deep in your chest.  Crushing, pressure, aching, or squeezing pain.  Dull or sharp pain that is  worse when you move, cough, or take a deep breath.  Pain that is also felt in your back, neck, shoulder, or arm, or pain that spreads to any of these areas. Your chest pain may come and go, or it may stay constant. DIAGNOSIS Lab tests or other studies may be needed to find the cause of your pain. Your health care provider may have you take a test called an ambulatory ECG (electrocardiogram). An ECG records your heartbeat patterns at the time the test is performed. You may also have other tests, such as:  Transthoracic echocardiogram (TTE). During echocardiography, sound waves are used to create a picture of all of the heart structures and to look at how blood flows through your heart.  Transesophageal echocardiogram (TEE).This is a more advanced imaging test that obtains images from inside your body. It allows your health care provider to see your heart in finer detail.  Cardiac monitoring. This allows your health care provider to monitor your heart rate and rhythm in real time.  Holter monitor. This is a portable device that records your heartbeat and can help to diagnose abnormal heartbeats. It allows your health care provider to track your heart activity for several days, if needed.  Stress tests. These can be done through exercise or by taking medicine that makes your heart beat more quickly.  Blood tests.  Imaging tests. TREATMENT  Your treatment depends on what is causing your chest pain. Treatment may include:  Medicines. These may include:  Acid blockers for heartburn.  Anti-inflammatory medicine.  Pain medicine for inflammatory conditions.  Antibiotic medicine, if an infection is present.  Medicines to dissolve blood clots.  Medicines to treat coronary artery disease.  Supportive care for conditions that do not require medicines. This may include:  Resting.  Applying heat or cold packs to injured areas.  Limiting activities until pain decreases. HOME CARE  INSTRUCTIONS  If you were prescribed an antibiotic medicine, finish it all even if you start to feel better.  Avoid any activities that bring on chest pain.  Do not use any tobacco products, including cigarettes, chewing tobacco, or electronic cigarettes. If you need help quitting, ask your health care provider.  Do not drink alcohol.  Take medicines only as directed by your health care provider.  Keep all follow-up visits as directed by your health care provider. This is important. This includes any further testing if your chest pain does not go away.  If heartburn is the cause for your chest pain, you may be told to keep your head raised (elevated) while sleeping. This reduces the chance that acid will go from your stomach into your esophagus.  Make lifestyle changes as directed by your health care provider. These may include:  Getting regular exercise. Ask your health care provider to suggest some activities that are safe for you.  Eating a heart-healthy diet. A registered dietitian can help you to learn healthy eating options.  Maintaining a healthy weight.  Managing diabetes, if necessary.  Reducing stress. SEEK MEDICAL CARE IF:  Your chest pain does not go away after treatment.  You have a rash with  blisters on your chest.  You have a fever. SEEK IMMEDIATE MEDICAL CARE IF:   Your chest pain is worse.  You have an increasing cough, or you cough up blood.  You have severe abdominal pain.  You have severe weakness.  You faint.  You have chills.  You have sudden, unexplained chest discomfort.  You have sudden, unexplained discomfort in your arms, back, neck, or jaw.  You have shortness of breath at any time.  You suddenly start to sweat, or your skin gets clammy.  You feel nauseous or you vomit.  You suddenly feel light-headed or dizzy.  Your heart begins to beat quickly, or it feels like it is skipping beats. These symptoms may represent a serious  problem that is an emergency. Do not wait to see if the symptoms will go away. Get medical help right away. Call your local emergency services (911 in the [place] Do not drive yourself to the hospital.   This information is not intended to replace advice given to you by your health care provider. Make sure you discuss any questions you have with your health care provider.   Document Released: 05/11/2005 Document Revised: 08/22/2014 Document Reviewed: 03/07/2014 Elsevier Interactive Patient Education 2016 Elsevier Inc.    Gastroesophageal Reflux Disease, Adult Normally, food travels down the esophagus and stays in the stomach to be digested. However, when a person has gastroesophageal reflux disease (GERD), food and stomach acid move back up into the esophagus. When this happens, the esophagus becomes sore and inflamed. Over time, GERD can create small holes (ulcers) in the lining of the esophagus.  CAUSES This condition is caused by a problem with the muscle between the esophagus and the stomach (lower esophageal sphincter, or LES). Normally, the LES muscle closes after food passes through the esophagus to the stomach. When the LES is weakened or abnormal, it does not close properly, and that allows food and stomach acid to go back up into the esophagus. The LES can be weakened by certain dietary substances, medicines, and medical conditions, including:  Tobacco use.  Pregnancy.  Having a hiatal hernia.  Heavy alcohol use.  Certain foods and beverages, such as coffee, chocolate, onions, and peppermint. RISK FACTORS This condition is more likely to develop in:  People who have an increased body weight.  People who have connective tissue disorders.  People who use NSAID medicines. SYMPTOMS Symptoms of this condition include:  Heartburn.  Difficult or painful swallowing.  The feeling of having a lump in the throat.  Abitter taste in the mouth.  Bad breath.  Having a large  amount of saliva.  Having an upset or bloated stomach.  Belching.  Chest pain.  Shortness of breath or wheezing.  Ongoing (chronic) cough or a night-time cough.  Wearing away of tooth enamel.  Weight loss. Different conditions can cause chest pain. Make sure to see your health care provider if you experience chest pain. DIAGNOSIS Your health care provider will take a medical history and perform a physical exam. To determine if you have mild or severe GERD, your health care provider may also monitor how you respond to treatment. You may also have other tests, including:  An endoscopy toexamine your stomach and esophagus with a small camera.  A test thatmeasures the acidity level in your esophagus.  A test thatmeasures how much pressure is on your esophagus.  A barium swallow or modified barium swallow to show the shape, size, and functioning of your esophagus. TREATMENT The goal  of treatment is to help relieve your symptoms and to prevent complications. Treatment for this condition may vary depending on how severe your symptoms are. Your health care provider may recommend:  Changes to your diet.  Medicine.  Surgery. HOME CARE INSTRUCTIONS Diet  Follow a diet as recommended by your health care provider. This may involve avoiding foods and drinks such as:  Coffee and tea (with or without caffeine).  Drinks that containalcohol.  Energy drinks and sports drinks.  Carbonated drinks or sodas.  Chocolate and cocoa.  Peppermint and mint flavorings.  Garlic and onions.  Horseradish.  Spicy and acidic foods, including peppers, chili powder, curry powder, vinegar, hot sauces, and barbecue sauce.  Citrus fruit juices and citrus fruits, such as oranges, lemons, and limes.  Tomato-based foods, such as red sauce, chili, salsa, and pizza with red sauce.  Fried and fatty foods, such as donuts, french fries, potato chips, and high-fat dressings.  High-fat meats, such  as hot dogs and fatty cuts of red and white meats, such as rib eye steak, sausage, ham, and bacon.  High-fat dairy items, such as whole milk, butter, and cream cheese.  Eat small, frequent meals instead of large meals.  Avoid drinking large amounts of liquid with your meals.  Avoid eating meals during the 2-3 hours before bedtime.  Avoid lying down right after you eat.  Do not exercise right after you eat. General Instructions  Pay attention to any changes in your symptoms.  Take over-the-counter and prescription medicines only as told by your health care provider. Do not take aspirin, ibuprofen, or other NSAIDs unless your health care provider told you to do so.  Do not use any tobacco products, including cigarettes, chewing tobacco, and e-cigarettes. If you need help quitting, ask your health care provider.  Wear loose-fitting clothing. Do not wear anything tight around your waist that causes pressure on your abdomen.  Raise (elevate) the head of your bed 6 inches (15cm).  Try to reduce your stress, such as with yoga or meditation. If you need help reducing stress, ask your health care provider.  If you are overweight, reduce your weight to an amount that is healthy for you. Ask your health care provider for guidance about a safe weight loss goal.  Keep all follow-up visits as told by your health care provider. This is important. SEEK MEDICAL CARE IF:  You have new symptoms.  You have unexplained weight loss.  You have difficulty swallowing, or it hurts to swallow.  You have wheezing or a persistent cough.  Your symptoms do not improve with treatment.  You have a hoarse voice. SEEK IMMEDIATE MEDICAL CARE IF:  You have pain in your arms, neck, jaw, teeth, or back.  You feel sweaty, dizzy, or light-headed.  You have chest pain or shortness of breath.  You vomit and your vomit looks like blood or coffee grounds.  You faint.  Your stool is bloody or  black.  You cannot swallow, drink, or eat.   This information is not intended to replace advice given to you by your health care provider. Make sure you discuss any questions you have with your health care provider.   Document Released: 05/11/2005 Document Revised: 04/22/2015 Document Reviewed: 11/26/2014 Elsevier Interactive Patient Education 2016 [employer].  Food Choices for Gastroesophageal Reflux Disease, Adult When you have gastroesophageal reflux disease (GERD), the foods you eat and your eating habits are very important. Choosing the right foods can help ease the discomfort of  GERD. WHAT GENERAL GUIDELINES DO I NEED TO FOLLOW?  Choose fruits, vegetables, whole grains, low-fat dairy products, and low-fat meat, fish, and poultry.  Limit fats such as oils, salad dressings, butter, nuts, and avocado.  Keep a food diary to identify foods that cause symptoms.  Avoid foods that cause reflux. These may be different for different people.  Eat frequent small meals instead of three large meals each day.  Eat your meals slowly, in a relaxed setting.  Limit fried foods.  Cook foods using methods other than frying.  Avoid drinking alcohol.  Avoid drinking large amounts of liquids with your meals.  Avoid bending over or lying down until 2-3 hours after eating. WHAT FOODS ARE NOT RECOMMENDED? The following are some foods and drinks that may worsen your symptoms: Vegetables Tomatoes. Tomato juice. Tomato and spaghetti sauce. Chili peppers. Onion and garlic. Horseradish. Fruits Oranges, grapefruit, and lemon (fruit and juice). Meats High-fat meats, fish, and poultry. This includes hot dogs, ribs, ham, sausage, salami, and bacon. Dairy Whole milk and chocolate milk. Sour cream. Cream. Butter. Ice cream. Cream cheese.  Beverages Coffee and tea, with or without caffeine. Carbonated beverages or energy drinks. Condiments Hot sauce. Barbecue sauce.  Sweets/Desserts Chocolate and  cocoa. Donuts. Peppermint and spearmint. Fats and Oils High-fat foods, including [place] fries and potato chips. Other Vinegar. Strong spices, such as black pepper, white pepper, red pepper, cayenne, curry powder, cloves, ginger, and chili powder. The items listed above may not be a complete list of foods and beverages to avoid. Contact your dietitian for more information.   This information is not intended to replace advice given to you by your health care provider. Make sure you discuss any questions you have with your health care provider.   Document Released: 08/01/2005 Document Revised: 08/22/2014 Document Reviewed: 06/05/2013 Elsevier Interactive Patient Education [employer]    To find a primary care or specialty doctor please call [phone] or [phone] to access "[facility] Find a Doctor Service."  You may also go on the [facility] website at [email or web address]  There are also multiple [facility] and [facility] practices throughout the [place] that are frequently accepting new patients. You may find a clinic that is close to your home and contact them.  [facility] -  [address] [place] [address] [phone]  [facility] in [place] (also locations in [place] and [place]) -  [address] [place] [address] [phone]  [facility] -  [address] [place] [address] [phone]

## 2016-01-22 NOTE — ED Notes (Signed)
Pt walked to the restroom with minimal assistance.

## 2016-01-22 NOTE — ED Notes (Signed)
MD at bedside. 

## 2021-11-24 ENCOUNTER — Other Ambulatory Visit: Payer: Self-pay | Admitting: Internal Medicine

## 2021-11-24 ENCOUNTER — Ambulatory Visit
Admission: RE | Admit: 2021-11-24 | Discharge: 2021-11-24 | Disposition: A | Discharge status: Home / Self Care | Payer: BC Managed Care – PPO | Source: Ambulatory Visit | Attending: Internal Medicine | Admitting: Internal Medicine

## 2021-11-24 DIAGNOSIS — Z1231 Encounter for screening mammogram for malignant neoplasm of breast: Secondary | ICD-10-CM

## 2022-09-14 IMAGING — MG MM DIGITAL SCREENING BILAT W/ TOMO AND CAD
8 series · 8 of 24 positions shown · non-contrast
Comparison: None.

CLINICAL DATA: Screening.

EXAM:
DIGITAL SCREENING BILATERAL MAMMOGRAM WITH TOMOSYNTHESIS AND CAD
TECHNIQUE: Bilateral screening digital craniocaudal and mediolateral oblique
mammograms were obtained. Bilateral screening digital breast
tomosynthesis was performed. The images were evaluated with
computer-aided detection.

[L MLO synth-2D]
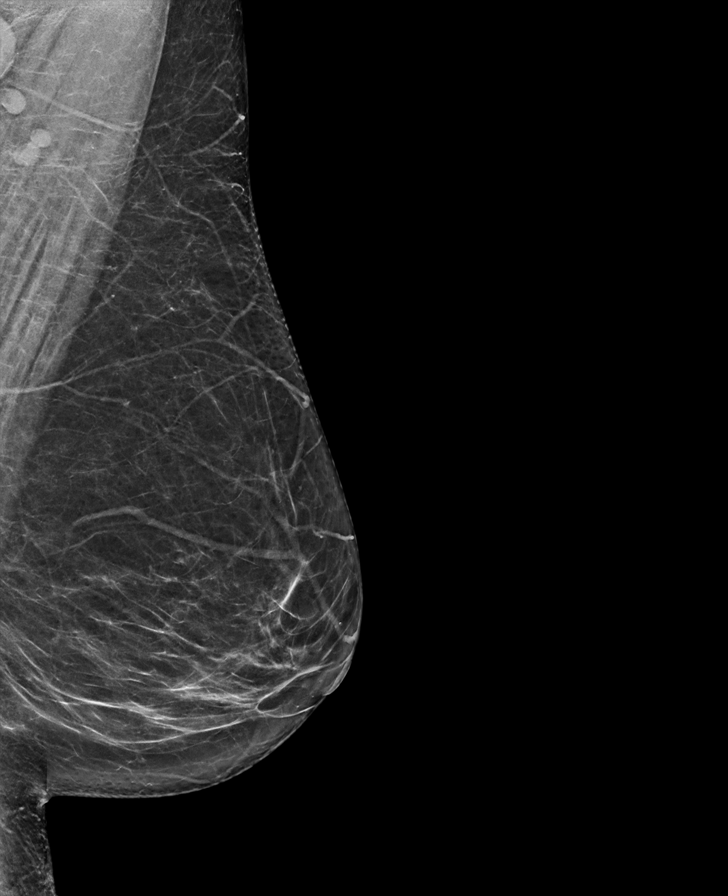

[R MLO synth-2D]
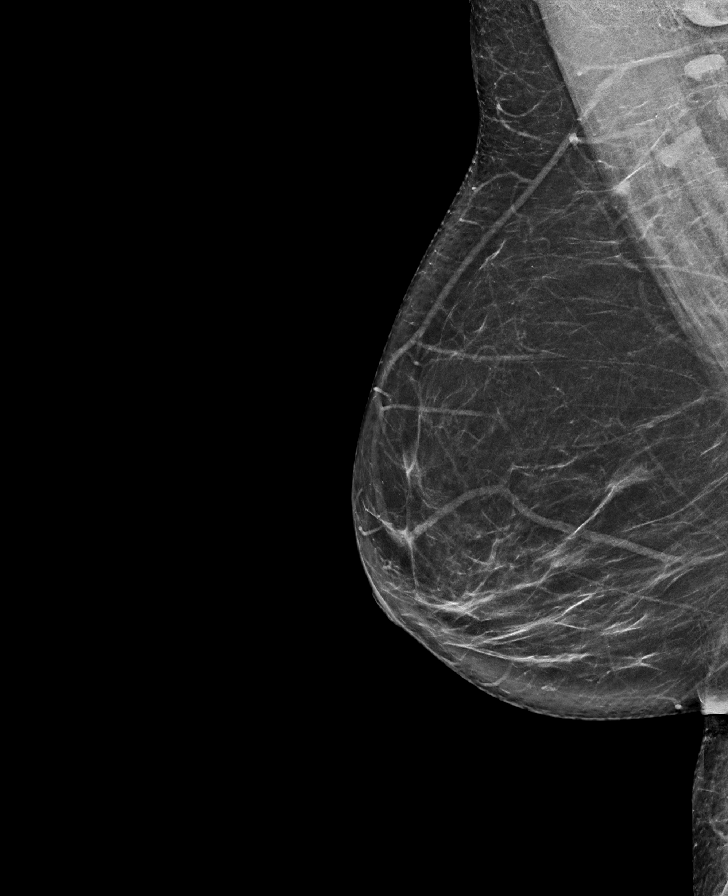

[L CC synth-2D]
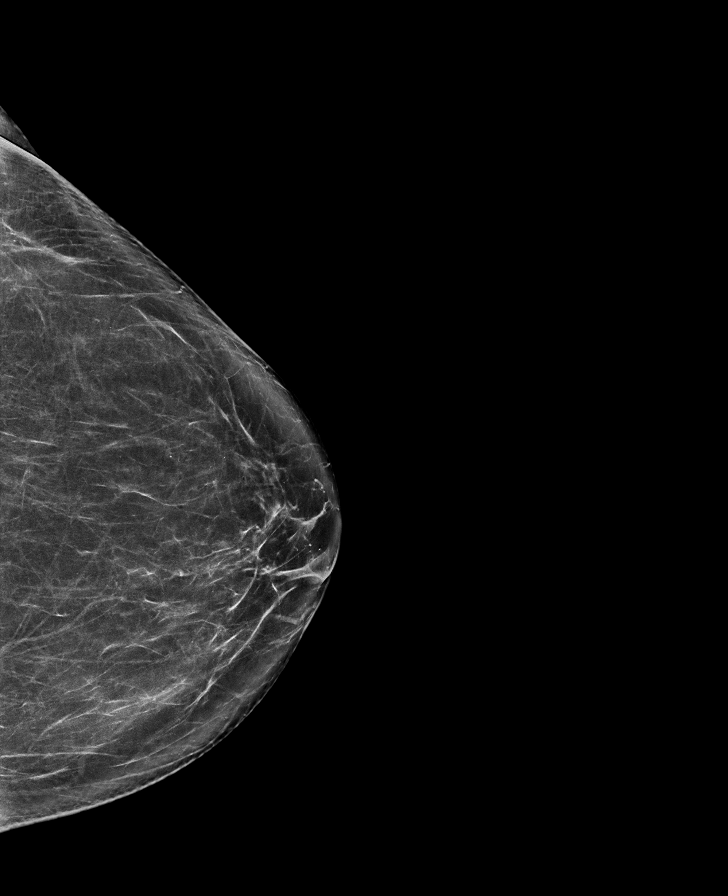

[R CC synth-2D]
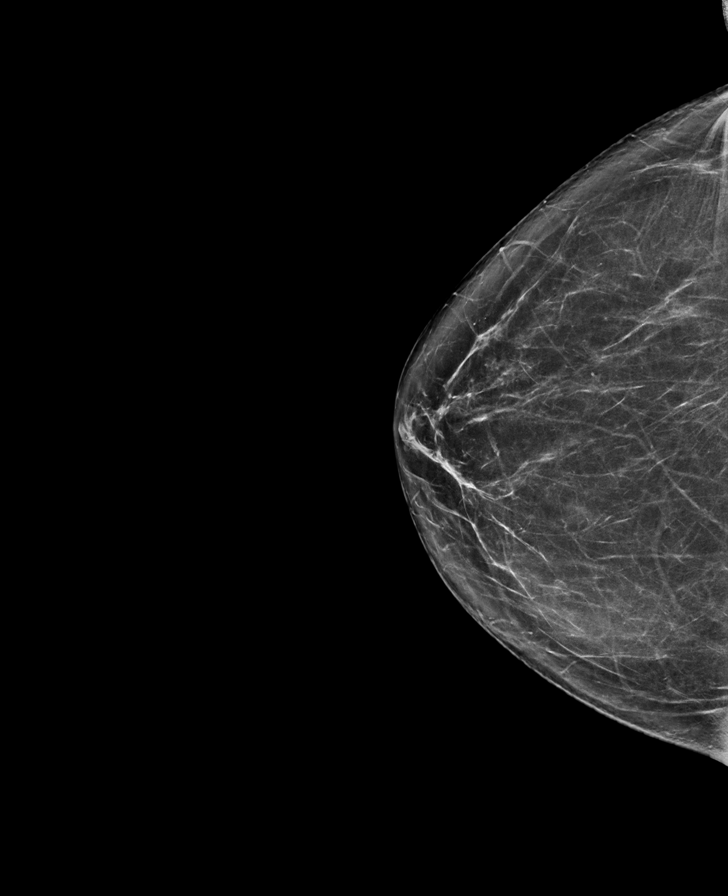

[R MLO tomo · tomo slice 41/80.0]
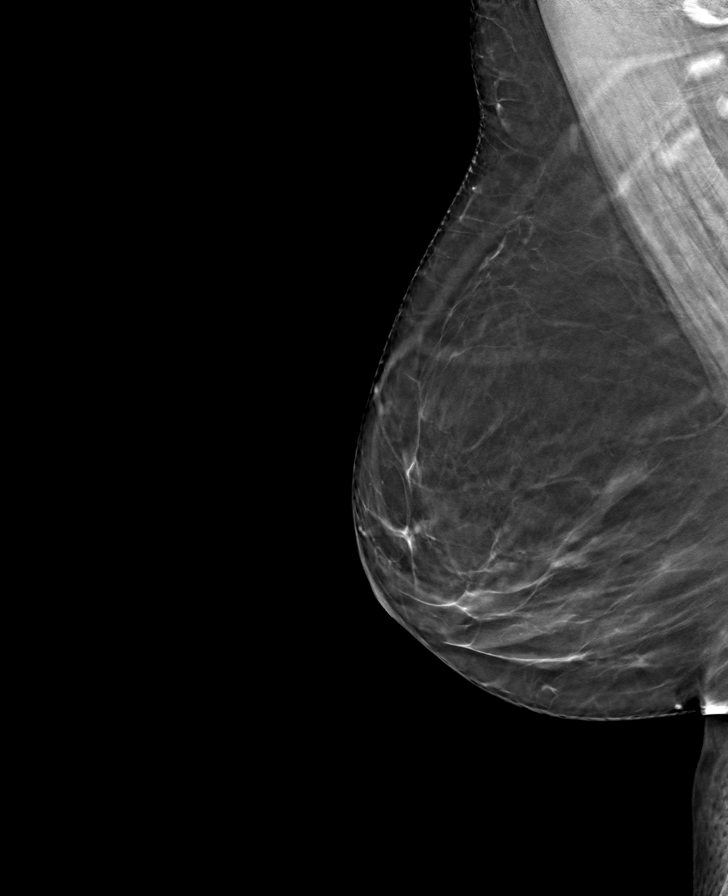

[L CC tomo · tomo slice 39/78.0]
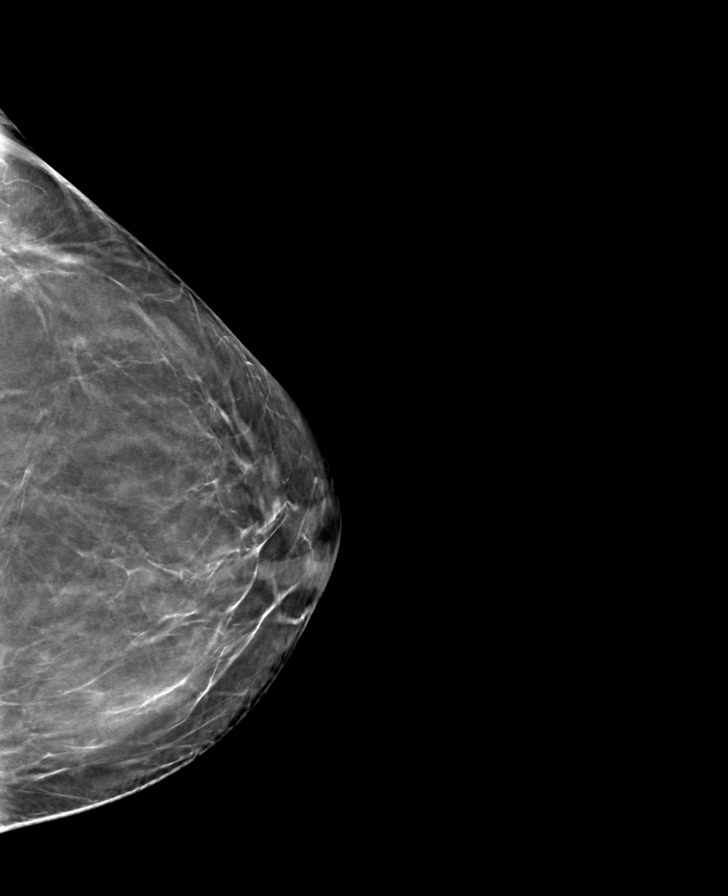

[L MLO tomo · tomo slice 39/77.0]
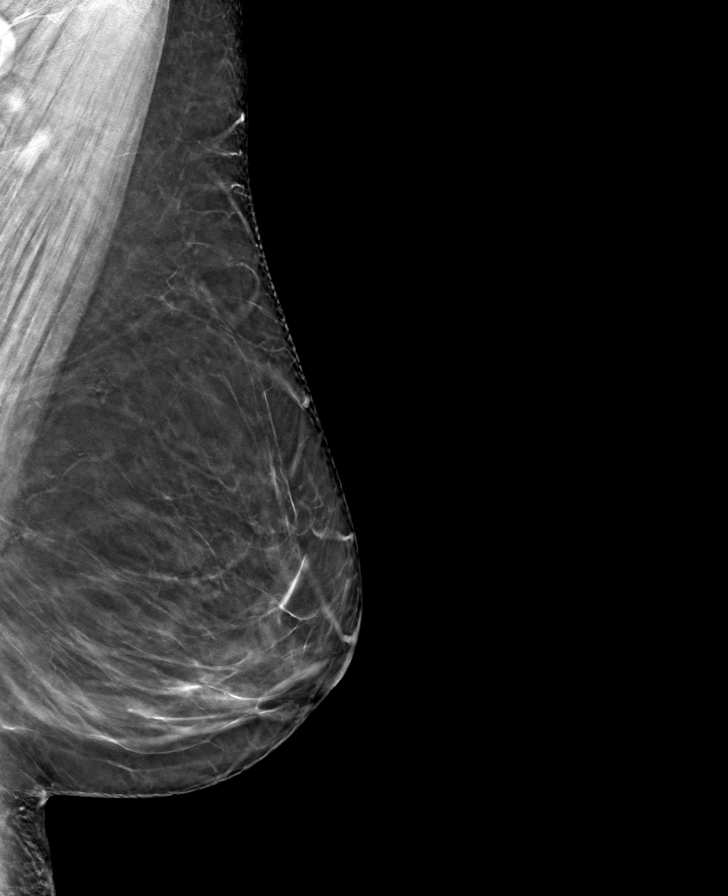

[R CC tomo · tomo slice 40/79.0]
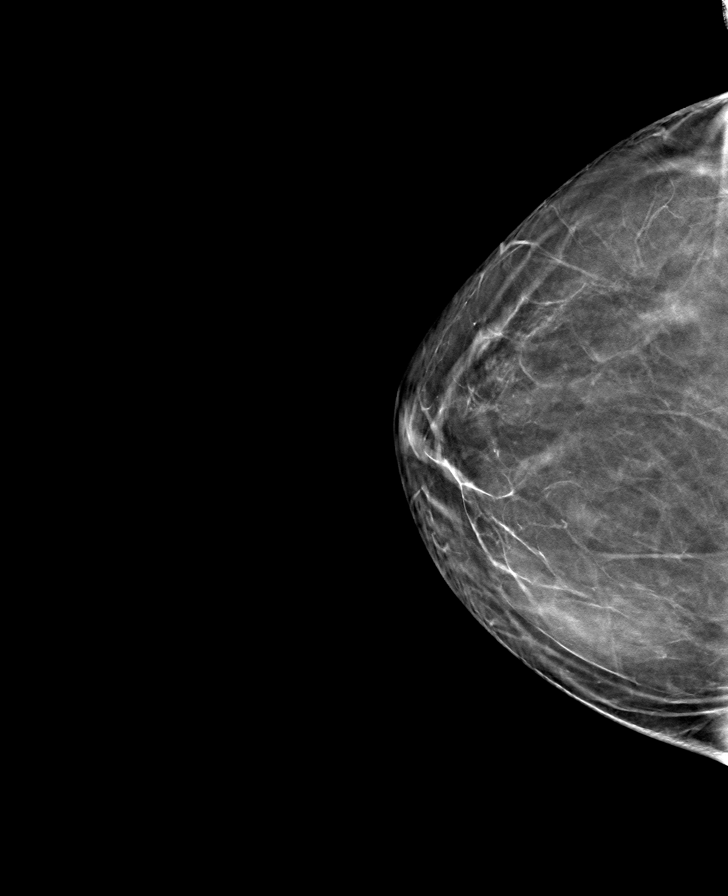

[8 of 24 positions shown; findings below may reference images not displayed]

ACR Breast Density Category b: There are scattered areas of
fibroglandular density.
FINDINGS: There are no findings suspicious for malignancy.
IMPRESSION: No mammographic evidence of malignancy. A result letter of this
screening mammogram will be mailed directly to the patient.

RECOMMENDATION:
Screening mammogram in one year. (Code:XG-X-X7B)

BI-RADS CATEGORY  1: Negative.

## 2023-10-17 ENCOUNTER — Other Ambulatory Visit (HOSPITAL_COMMUNITY): Payer: Self-pay | Admitting: Internal Medicine

## 2023-10-17 DIAGNOSIS — R221 Localized swelling, mass and lump, neck: Secondary | ICD-10-CM

## 2023-10-22 ENCOUNTER — Ambulatory Visit (HOSPITAL_COMMUNITY)
Admission: RE | Admit: 2023-10-22 | Discharge: 2023-10-22 | Disposition: A | Discharge status: Home / Self Care | Source: Ambulatory Visit | Attending: Internal Medicine | Admitting: Internal Medicine

## 2023-10-22 DIAGNOSIS — R221 Localized swelling, mass and lump, neck: Secondary | ICD-10-CM | POA: Diagnosis present

## 2023-10-29 ENCOUNTER — Ambulatory Visit (HOSPITAL_COMMUNITY): Payer: Self-pay
